# Patient Record
Sex: Male | Born: 1960 | Race: White | Hispanic: No | Marital: Married | Smoking: Never smoker
Health system: Southern US, Community
[De-identification: ages and names within clinical notes are randomized; demographics above are authoritative.]

## PROBLEM LIST (undated history)

## (undated) HISTORY — PX: HIP SURGERY: SHX245

## (undated) HISTORY — PX: KNEE SURGERY: SHX244

---

## 2000-08-11 ENCOUNTER — Ambulatory Visit (HOSPITAL_BASED_OUTPATIENT_CLINIC_OR_DEPARTMENT_OTHER): Admission: RE | Admit: 2000-08-11 | Discharge: 2000-08-11 | Payer: Self-pay | Admitting: Orthopedic Surgery

## 2002-09-30 ENCOUNTER — Ambulatory Visit (HOSPITAL_COMMUNITY): Admission: RE | Admit: 2002-09-30 | Discharge: 2002-09-30 | Payer: Self-pay | Admitting: *Deleted

## 2002-09-30 ENCOUNTER — Encounter (INDEPENDENT_AMBULATORY_CARE_PROVIDER_SITE_OTHER): Payer: Self-pay

## 2007-10-03 ENCOUNTER — Emergency Department (HOSPITAL_COMMUNITY): Admission: EM | Admit: 2007-10-03 | Discharge: 2007-10-03 | Payer: Self-pay | Admitting: Family Medicine

## 2007-10-23 ENCOUNTER — Inpatient Hospital Stay (HOSPITAL_COMMUNITY): Admission: EM | Admit: 2007-10-23 | Discharge: 2007-10-28 | Payer: Self-pay

## 2009-09-12 ENCOUNTER — Emergency Department (HOSPITAL_COMMUNITY): Admission: EM | Admit: 2009-09-12 | Discharge: 2009-09-12 | Payer: Self-pay | Admitting: Emergency Medicine

## 2010-03-29 IMAGING — CR DG ANKLE COMPLETE 3+V*L*
3 series · 3 of 3 positions shown · non-contrast
Comparison: None

CLINICAL DATA: Trauma.

LEFT ANKLE COMPLETE - 3+ VIEW

[view not recorded (1 of 3)]
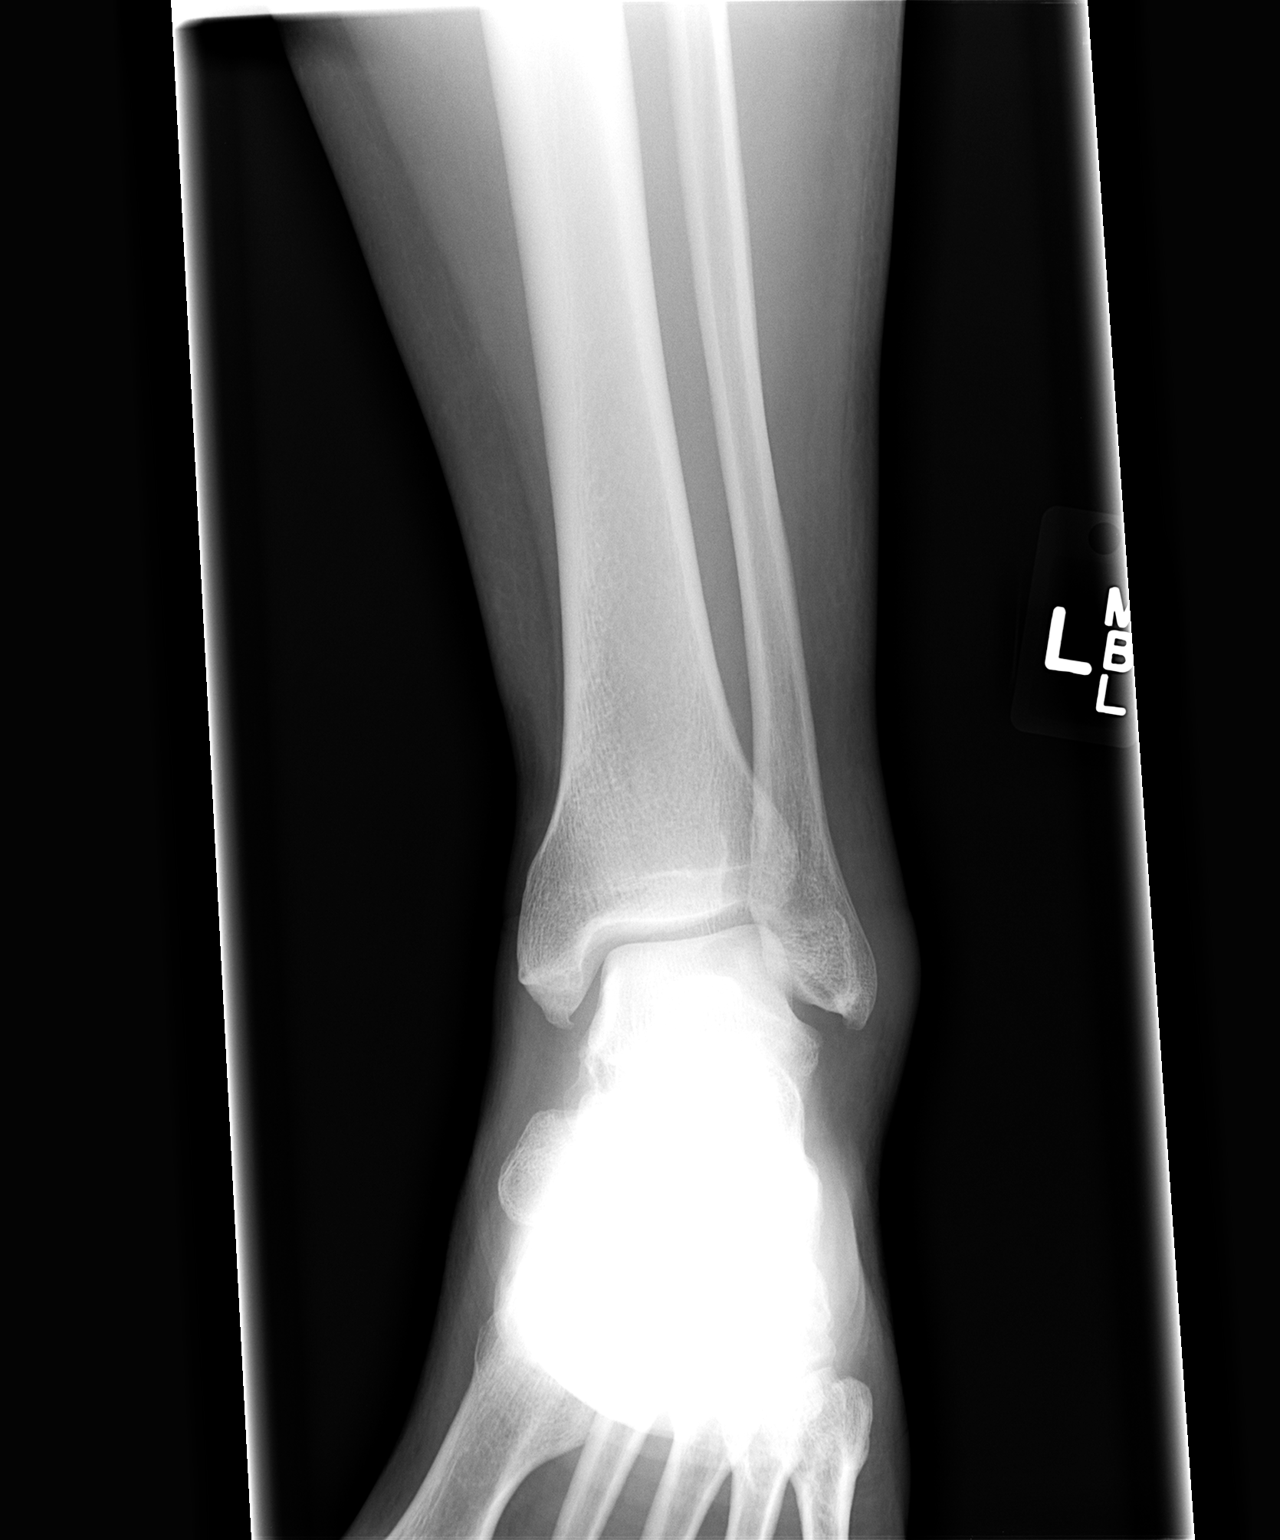

[view not recorded (2 of 3)]
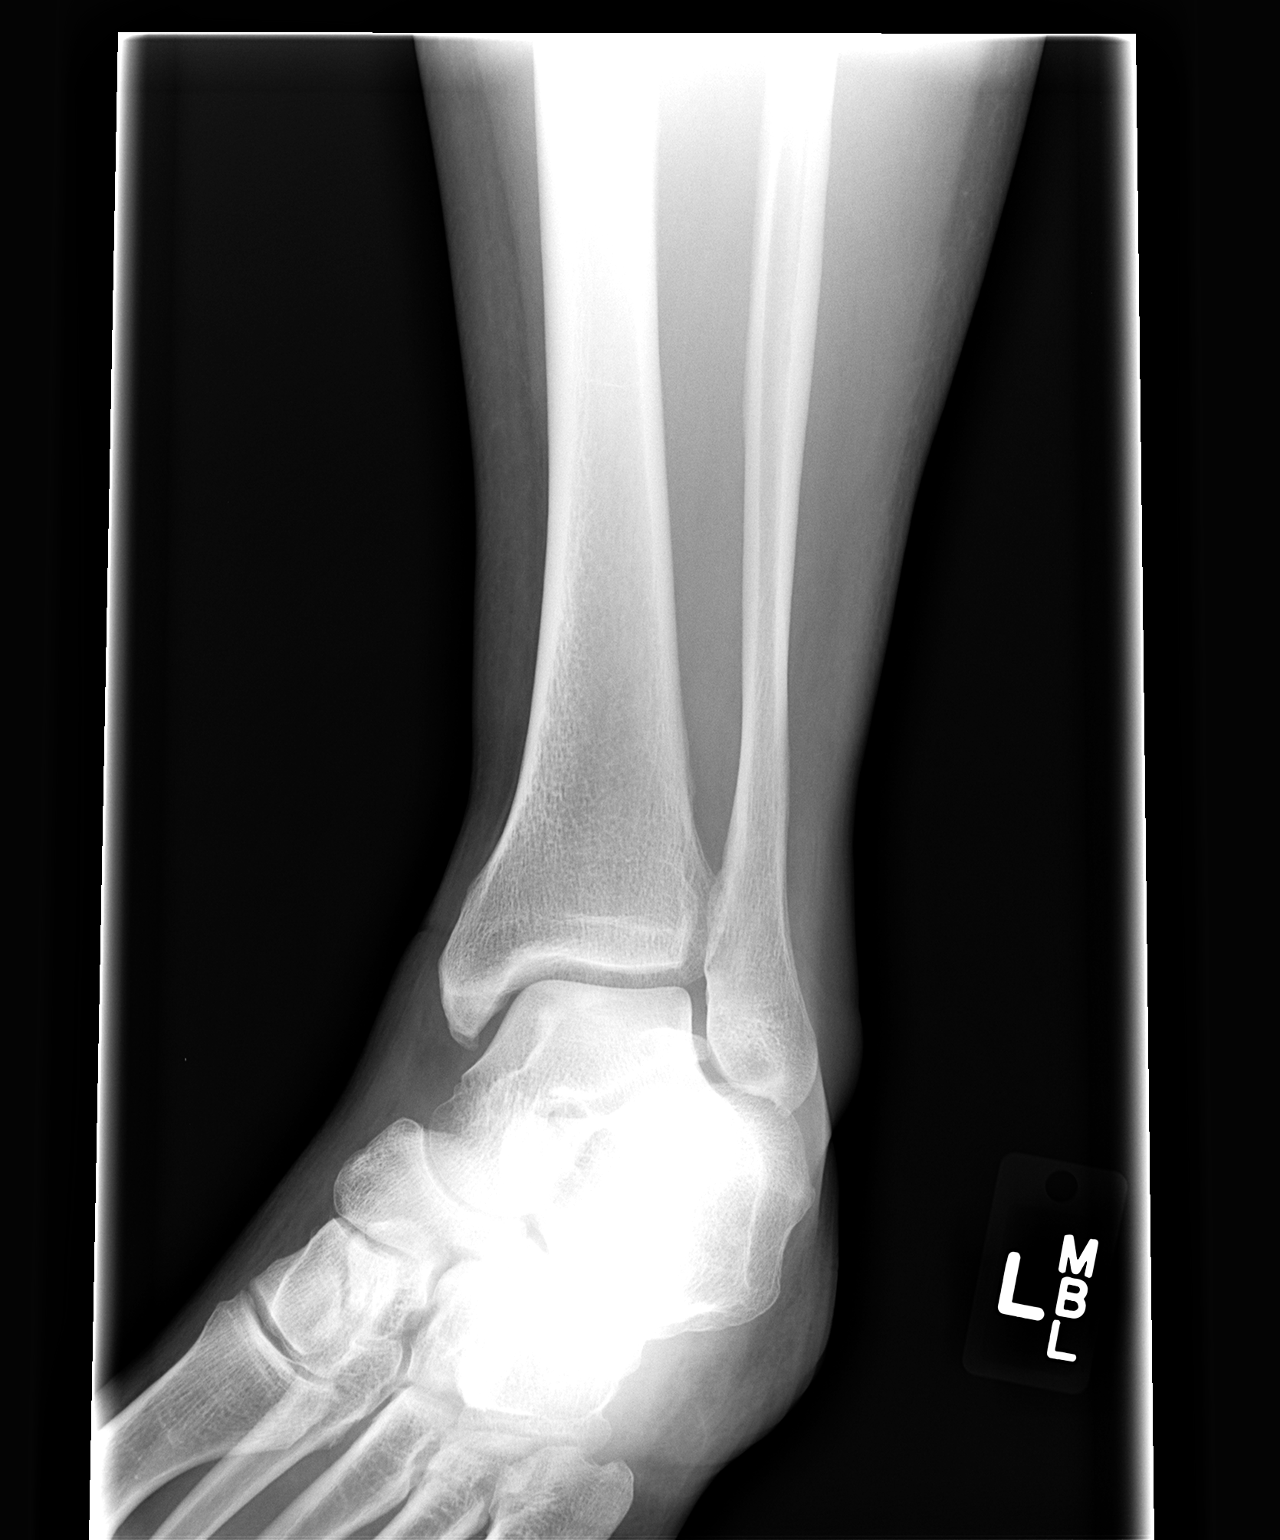

[view not recorded (3 of 3)]
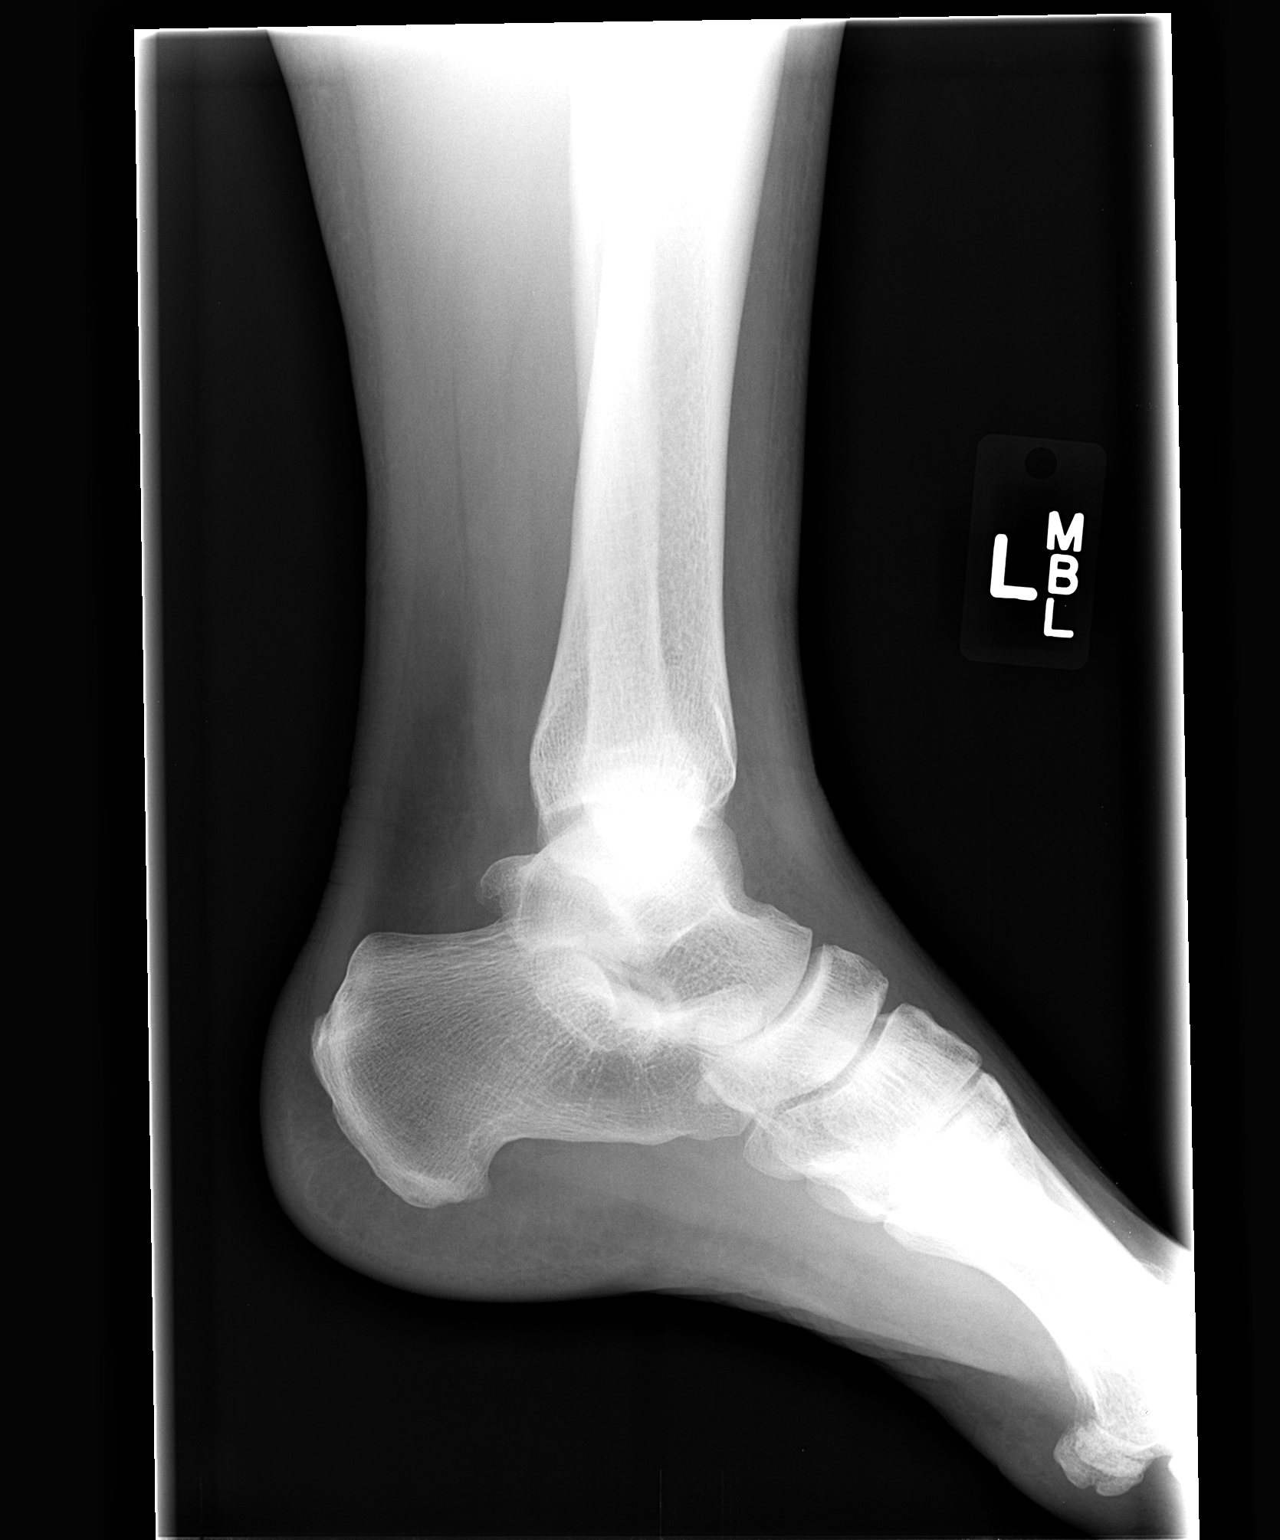

[3 of 3 positions shown; findings below may reference images not displayed]

FINDINGS: There is lateral soft tissue swelling.

No underlying fracture or dislocation is identified.

No radio-opaque foreign bodies or soft tissue calcifications are
noted.
IMPRESSION: 1.  Soft tissue swelling.
2.  No fractures

## 2010-05-25 ENCOUNTER — Ambulatory Visit
Admission: RE | Admit: 2010-05-25 | Discharge: 2010-05-25 | Disposition: A | Discharge status: Home / Self Care | Payer: No Typology Code available for payment source | Source: Ambulatory Visit | Attending: Physical Medicine and Rehabilitation | Admitting: Physical Medicine and Rehabilitation

## 2010-05-25 ENCOUNTER — Other Ambulatory Visit: Payer: Self-pay | Admitting: Physical Medicine and Rehabilitation

## 2010-05-25 DIAGNOSIS — Z0289 Encounter for other administrative examinations: Secondary | ICD-10-CM

## 2010-06-01 NOTE — H&P (Signed)
[name], [name]               ACCOUNT NO.:  [id number]   MEDICAL RECORD NO.:  [id number]          PATIENT TYPE:  INP   LOCATION:  1610                         FACILITY:  [facility]   PHYSICIAN:  Jene Every, M.D.    DATE OF BIRTH:  February 03, 1960   DATE OF ADMISSION:  10/23/2007  DATE OF DISCHARGE:                              HISTORY & PHYSICAL   CHIEF COMPLAINT:  Right hip pain.   HISTORY:  A 50 year old fell 30 feet from a ladder onto his hip, acute  back pain.  Initially, he had some significant numbness into his right  leg, had persistent numbness.  Denies back pain or neck pain.  No loss  of consciousness.  No numbness or tingling in the upper extremities.   PAST MEDICAL HISTORY:  Gastritis.   MEDICATIONS:  Protonix p.r.n.   ALLERGIES:  SULFA.   PHYSICAL EXAMINATION:  GENERAL:  Healthy, mild distress.  Mood and  affect appropriate.  He is postured in flexion of the right hip and  knee.  SKIN:  Intact.  He has small abrasion over the lateral aspect of the  hand but full range of motion of the wrist and hand.  NECK:  C-spine and L-spine are nontender.  PELVIS:  Stable to compression.  EXTREMITIES:  Upper extremity exam is, otherwise, normal.  The right leg  is flexed, again 2+ pulses.  He has decreased sensation in dorsolateral  aspect of foot.  There was swelling into the right buttock, though his  compartment is soft.   Radiographs demonstrates displaced intertrochanteric hip fracture.   IMPRESSION:  1. Displaced intertrochanteric hip fracture acute.  2. Sciatic neurapraxia versus hematoma and fracture displacement.   PLAN:  We will proceed with closed reduction intramedullary nailing on  emergent basis due to the fracture displacement and the dysesthesia in  the right lower extremity.  Risks and benefits discussed of wound,  bleeding, infection, DVT, PE, anesthetic complications, nonunion,  malunion, need for total hip in the further.  We will proceed   accordingly.     Jene Every, M.D.  Electronically Signed    JB/MEDQ  D:  10/23/2007  T:  10/24/2007  Job:  [id number]

## 2010-06-01 NOTE — Op Note (Signed)
[name], [name]               ACCOUNT NO.:  [id number]   MEDICAL RECORD NO.:  [id number]          PATIENT TYPE:  INP   LOCATION:  5034                         FACILITY:  [facility]   PHYSICIAN:  Jene Every, M.D.    DATE OF BIRTH:  October 18, 1960   DATE OF PROCEDURE:  10/23/2007  DATE OF DISCHARGE:                               OPERATIVE REPORT   PREOPERATIVE DIAGNOSIS:  Displaced right intertrochanteric hip fracture.   POSTOPERATIVE DIAGNOSIS:  Displaced right intertrochanteric hip  fracture.   PROCEDURE PERFORMED:  Closed reduction under anesthesia followed by  intramedullary nailing of the right hip.   ANESTHESIA:  General.   ASSISTANT:  None.   BRIEF HISTORY AND INDICATIONS:  A 50 year old fell off a ladder on to  his hip, sustaining an intertrochanteric hip fracture, also had numbness  and dysesthesia into the right leg, and the dorsum and lateral aspect of  the foot.  Initially significant, less so immediately preoperatively.  He was indicated for urgent closed reduction and intramedullary fixation  due to displacement of the fracture and possible effect on the  neurovascular structures.  Risks and benefits discussed including  bleeding, infection, suboptimal range of motion, DVT, PE, anesthetic  complications, and need for revision, total hip, etc.   TECHNIQUE:  The patient in supine position.   The patient in the holding room, underwent a closed reduction under  local sedation due to the delay in the operating room.  We reduced the  fracture, had some improvement in the numbness into his leg.  He had  good dorsiflexion, plantarflexion, and good pulses.   He was then taken to the operative room, underwent general anesthesia,  placed on the fracture table.  Right lower extremity was placed in  longitudinal traction, left in gentle flexion and external rotation.  Perineal post well padded.  Foley was placed.  The right hip and leg was  prepped and draped in usual sterile  fashion.  Under x-ray, the fracture  was reduced near-anatomically.  We made incision proximal to the greater  trochanter.  I incised the fascia, placed a guide pin on the tip of the  trochanter, advanced it into the canal under AP and lateral, overreamed  it, used a 11 mm diameter 130 short nail, advanced it without difficulty  with excellent purchase, I used a guidepin, then advanced it up into the  femoral head neck after a small stab incision was made laterally.  This  was measured to a 115, reamed and tapped to 115 and inserted a 115 lag  screw, and excellent purchase.  This was then compressed.  Traction was  released prior to that.  We placed a distal locking screw in the static  slot.  Small stab incision and blunt dissection down to the femur.  We  drilled and measured it to a 38, inserted a screw bicortically with  excellent purchase in the AP and lateral plane, anatomic reduction of  the fracture and excellent placement of the hardware.  Wounds were  copiously irrigated, the external alignment jig was then removed.  Wound  was copiously irrigated.  Electrocautery was utilized to achieve  hemostasis.  A 0.25% Marcaine with epinephrine was injected in the  proximal hip wound, repaired the fascia with #1 Vicryl interrupted  figure-of-eight sutures.  Subcutaneous tissue were reapproximated with 2-  0 Vicryl simple sutures.  Skin was reapproximated with staples, this was  all 3 wounds.  Wound was dressed  sterilely, he was removed from the fracture table onto the hospital bed,  extubated without difficulty, and transported to the recovery room in  satisfactory condition.   The patient tolerated the procedure well with no complications.  Blood  loss 150 mL.       Jene Every, M.D.  Electronically Signed     JB/MEDQ  D:  10/24/2007  T:  10/24/2007  Job:  [id number]

## 2010-06-04 NOTE — Op Note (Signed)
   [name], [name]                         ACCOUNT NO.:  [id number]   MEDICAL RECORD NO.:  [id number]                   PATIENT TYPE:  AMB   LOCATION:  ENDO                                 FACILITY:  [facility]   PHYSICIAN:  Georgiana Spinner, M.D.                 DATE OF BIRTH:  [birth year]   DATE OF PROCEDURE:  DATE OF DISCHARGE:                                 OPERATIVE REPORT   PROCEDURE:  Upper endoscopy.   INDICATIONS FOR PROCEDURE:  Gastroesophageal reflux disease.   ANESTHESIA:  Demerol 70, Versed 8 mg.   DESCRIPTION OF PROCEDURE:  With the patient mildly sedated in the left  lateral decubitus position, the Olympus videoscopic endoscope was inserted  in the mouth and passed under direct vision through the esophagus which  appeared normal into the stomach. The fundus, body, antrum, duodenal bulb  and second portion of the duodenum were visualized. From this point, the  endoscope was slowly withdrawn taking circumferential views of the duodenal  mucosa until the endoscope was then pulled back in the stomach, placed in  retroflexion to view the stomach from below. The endoscope was then  straightened and withdrawn taking circumferential views of the remaining  gastric and esophageal mucosa. The patient's vital signs and pulse oximeter  remained stable. The patient tolerated the procedure well without apparent  complications.   FINDINGS:  Unremarkable examination.   PLAN:  Proceed to colonoscopy.                                               Georgiana Spinner, M.D.    GMO/MEDQ  D:  09/30/2002  T:  09/30/2002  Job:  [id number]

## 2010-06-04 NOTE — Op Note (Signed)
[place]  Patient:    MILLARD, BAUTCH                      MRN: [id number] Proc. Date: 08/11/00 Adm. Date:  [id number] Attending:  Cornell Barman                           Operative Report  PREOPERATIVE DIAGNOSIS:  Possible internal derangement right knee.  POSTOPERATIVE DIAGNOSIS:  Chondromalacia medial patella facet right knee.  PROCEDURE:  Arthroscopy and debridement medial patellar facet right knee.  SURGEON:  Lenard Galloway. Chaney Malling, M.D.  ANESTHESIA:  MAC.  PATHOLOGY:  With an arthroscope in the knee and a very careful examination both compartments are undertaken.  The articular cartilage over both femoral condyles, both the medial tibial plateaus, and the entire circumflex of the medial and lateral meniscus were examined extremely carefully.  There is absolutely no pathology.  The dorsal and ventral surfaces of both meniscus were palpated including the posterior horn.  The arthroscope was also placed through the posterior recess and the posterior attachment of both menisci were evaluated and these were all normal.  In the medial recess there was a synovial tag up against the medial femoral condyle.  This was somewhat hypertrophic.  The patellofemoral joint was then visualized.  The articular cartilage in the femoral notch area was normal.  There was an area of chondromalacia over the medial patellar facet, otherwise no pathology could be seen in the knee.  When the arthroscope was introduced in the knee, a great deal of synovial fluid was drained.  PROCEDURE:  The patient was placed on the operating table in the supine position with a pneumatic tourniquet applied about the right upper thigh.  The right leg was placed in a leg holder and the entire right lower extremity prepped with Duraprep and draped out in the usual manner.  Marcaine ______, and Xylocaine and epinephrine were used to infiltrate the puncture wounds.   An infusion cannula was placed in the superior medial pouch and the knee ______ with saline.  Anterior median and anterolateral portals were made and the arthroscope was introduced.  As noted above no significant pathology could be seen.  There was a synovial tag which was somewhat hypertrophic and erythematous along the medial femoral condyle.  This was debrided with an intra-articular shaver.  Attention was then turned to the medial patellar facet.  The medial facet was debrided with a chondroplastic shaver through both anteromedian and anterolateral portals.  Excellent decompression was achieved.  Marcaine was then placed in the knee and a large bulky compression dressing applied.  The patient having returned to recovery room in excellent condition.  Technically this procedure went extremely well.  FOLLOWUP:  Follow up in my office in 1 week. DD:  08/11/00 TD:  08/11/00 Job: [id number]

## 2010-06-04 NOTE — Discharge Summary (Signed)
[name], [name]               ACCOUNT NO.:  [id number]   MEDICAL RECORD NO.:  [id number]          PATIENT TYPE:  INP   LOCATION:  1478                         FACILITY:  [facility]   PHYSICIAN:  Jene Every, M.D.    DATE OF BIRTH:  06-06-1960   DATE OF ADMISSION:  10/23/2007  DATE OF DISCHARGE:  10/28/2007                               DISCHARGE SUMMARY   ADMISSION DIAGNOSES:  1. Right intertrochanteric hip fracture compressing sciatic nerve.  2. Gastroesophageal reflux disease.  3. History of gastritis.   DISCHARGE DIAGNOSES:  1. Status post intramedullary nail of the right hip, asymptomatic.  2. Postoperative anemia.   PROCEDURE:  The patient was taken to the OR on October 23, 2007 and  underwent IM nailing of the right hip.   SURGEON:  Jene Every, MD   ASSISTANT:  None.   ANESTHESIA:  General.   COMPLICATIONS:  None.   CONSULTATIONS:  PT, OT, and case management.   HISTORY:  [name] is a pleasant 50 year old gentleman who fell  approximately 30 feet off a ladder.  He noted acute onset of hip and  back pain and initially was evaluated in the emergency room by Dr.  Shelle Iron.  He had significant numbness in the right lower extremity that  persisted.  He denied any loss of consciousness.  X-rays did reveal a  displaced intertrochanteric hip fracture that was compressing the  sciatic nerve.  Secondary to the hematoma and fracture displacement, it  was felt the patient would need immediate closed reduction and IM  nailing.  Risks and benefits of this were discussed with the patient and  his family and they did elect to proceed.   HOSPITAL COURSE:  The patient was admitted and taken immediately to the  operating room.  He underwent the above stated procedure without  difficulties, then transferred to the PACU and then to the orthopedic  floor for continued postoperative care.  Postoperatively, he did fairly  well.  He did have some pain control issues.  Labs remained  stable  throughout the hospital course.  The preoperative H and H 14.3 and 41.9.  This was monitored throughout the hospital course.  Hemoglobin did drop  to a level of 12.6 and hematocrit 26.5 at time of discharge.  Slightly  elevated white cell count, but this normalized prior to discharge.  The  patient was placed on Coumadin postoperatively for DVT prophylaxis.  At  the time of discharge, he had PT of 20.9 and INR of 1.7.  Throughout the  hospital course, the incision remained clean and dry.  Motor and  neurovascular function continued to improve into the lower extremity.  The pedal pulses were equal.  The patient did progress slowly with  therapy.  Pain medication was adjusted accordingly.  Once the patient's  pain was controlled, he was felt stable to be discharged home with home  health, PT, and OT.   DISPOSITION:  The patient was discharged home.   DIET:  Advance as tolerated.   DISCHARGE MEDICATIONS:  1. Percocet 5/325 one to two p.o. q. 4-6 p.r.n. pain.  2. Robaxin 500 mg one p.o. q.8 p.r.n. spasm.  3. Coumadin as dosed per pharmacy.  4. Vitamin C 500 mg.   ACTIVITIES:  Partial weightbearing on the right lower extremity.  Continue with ice and daily wound care including dressing changes.  Okay  for him to shower.  Hip precautions.   CONDITION ON DISCHARGE:  Stable.   FINAL DIAGNOSIS:  Status post right intramedullary nailing of the hip.      Roma Schanz, P.A.      Jene Every, M.D.  Electronically Signed    CS/MEDQ  D:  12/17/2007  T:  12/17/2007  Job:  [id number]

## 2010-06-04 NOTE — Op Note (Signed)
[place]. [facility]  Patient:    [name], [name]                      MRN: [id number] Proc. Date: 08/11/00 Adm. Date:  [id number] Attending:  Cornell Barman                           Operative Report  PREOPERATIVE DIAGNOSIS:  Possible internal derangement, right knee.  POSTOPERATIVE DIAGNOSIS:  Chondromalacia, medial patellar facet, right knee.  PROCEDURE:  Arthroscopy and debridement of medial patellar facet, right knee.  SURGEON:  Lenard Galloway. Chaney Malling, M.D.  ANESTHESIA:  MAC.  PATHOLOGY:  With an arthroscope in the knee, very careful examination of both compartments was undertaken.  The articular cartilage over both femoral condyles, both the medial tibial plateaus, and the entire circumference of the medial and lateral meniscus were examined extremely carefully.  There was absolutely no pathology.  The dorsal and ventral surface of both menisci were palpated, including the posterior horn.  The arthroscope was also placed in through the posterior recess, and the posterior attachment of both menisci were evaluated and these were all normal.  In the medial recess, there was a synovial tag up against the medial femoral condyle.  This was somewhat hypertrophic.  The patellofemoral joint was then visualized.  The articular cartilage in the femoral notch area was normal.  There was an area of chondromalacia over the medial patellar facet.  Otherwise no pathology could be seen in the knee.  When the arthroscope was introduced in the knee, a great deal of synovial fluid was drained.  DESCRIPTION OF PROCEDURE:  Patient placed on the operating table in the supine position with the pneumatic tourniquet about the right thigh.  The right leg was placed in the leg holder, entire right lower extremity prepped with Duraprep and draped out in the usual manner.  Marcaine then placed in the knee and Xylocaine and epinephrine used to infiltrated the puncture  wounds.  An infusion cannula was placed in the superior medial pouch and the knee distended with saline.  Anterior medial and anterior lateral portals were made, and the arthroscope was introduced.  As noted above, no significant pathology could be seen.  There was a synovial tag, which was somewhat hypertrophic and erythematous along the medial femoral condyle.  This was debrided with the intra-articular shaver.  Attention was then turned to the medial patellar facet.  The medial facet was debrided with the chondroplastic shaver for both the anterior medial and anterior lateral portals.  Excellent decompression was achieved.  Marcaine was then placed in the knee and a large bulky compression dressing applied.  The patient then returned to the recovery room in excellent condition.  Technically this procedure went extremely well.  FOLLOW-UP CARE:  To my office in one week. DD:  08/11/00 TD:  08/11/00 Job: [id number]

## 2010-06-04 NOTE — Op Note (Signed)
   [name], [name]                         ACCOUNT NO.:  [id number]   MEDICAL RECORD NO.:  [id number]                   PATIENT TYPE:  AMB   LOCATION:  ENDO                                 FACILITY:  [facility]   PHYSICIAN:  Georgiana Spinner, M.D.                 DATE OF BIRTH:  Jan 02, 1961   DATE OF PROCEDURE:  09/30/2002  DATE OF DISCHARGE:                                 OPERATIVE REPORT   PROCEDURE:  Colonoscopy.   ANESTHESIA:  Demerol 20 mg, Versed 1 mg.   DESCRIPTION OF PROCEDURE:  With the patient mildly sedated in the left  lateral decubitus position, a rectal exam was performed which was  unremarkable.  Subsequently the Olympus videoscopic colonoscope was inserted  in the rectum and passed under direct vision to the cecum, identified by the  ileocecal valve and crow's foot of the cecum.  From this point the  colonoscope was slowly withdrawn, taking circumferential views of the  colonic mucosa, stopping at 20 cm from the anal verge, where one small polyp  was seen, photographed, and removed using hot biopsy forceps technique, a  setting of 20-200 on the ERBE argon photocoagulator pulse generator.  The  endoscope was withdrawn then all the way to the rectum, which appeared  normal on direct and retroflexed view.  The endoscope was straightened and  withdrawn.  The patient's vital signs and pulse oximetry remained stable.  The patient tolerated the procedure well without apparent complications.   FINDINGS:  Polyp at 20 cm from the anal verge.   Await biopsy report.  The patient will call me for results and follow up  with me as an outpatient.                                               Georgiana Spinner, M.D.    GMO/MEDQ  D:  09/30/2002  T:  09/30/2002  Job:  [id number]

## 2010-10-18 LAB — PROTIME-INR
INR: 1.2
INR: 1.3
INR: 1.7 — ABNORMAL HIGH
INR: 1.7 — ABNORMAL HIGH
INR: 2.3 — ABNORMAL HIGH
Prothrombin Time: 16.2 — ABNORMAL HIGH
Prothrombin Time: 20.9 — ABNORMAL HIGH

## 2010-10-18 LAB — DIFFERENTIAL
Basophils Absolute: 0.1
Basophils Relative: 1
Eosinophils Absolute: 0.1
Monocytes Absolute: 0.8
Monocytes Relative: 5
Neutrophils Relative %: 80 — ABNORMAL HIGH

## 2010-10-18 LAB — CBC
HCT: 37.5 — ABNORMAL LOW
HCT: 41.9
Hemoglobin: 12.7 — ABNORMAL LOW
Hemoglobin: 13
MCHC: 33.9
MCHC: 34
MCHC: 34
MCV: 95.1
Platelets: 170
Platelets: 194
Platelets: 208
RBC: 4.02 — ABNORMAL LOW
RBC: 4.4
RDW: 13.8
WBC: 12.1 — ABNORMAL HIGH

## 2010-10-18 LAB — BASIC METABOLIC PANEL
BUN: 11
Creatinine, Ser: 0.85
Glucose, Bld: 98
Potassium: 3.6

## 2010-10-18 LAB — APTT
aPTT: 30
aPTT: 44 — ABNORMAL HIGH

## 2011-08-18 ENCOUNTER — Encounter (HOSPITAL_COMMUNITY): Payer: Self-pay

## 2011-08-18 ENCOUNTER — Emergency Department (INDEPENDENT_AMBULATORY_CARE_PROVIDER_SITE_OTHER)
Admission: EM | Admit: 2011-08-18 | Discharge: 2011-08-18 | Disposition: A | Discharge status: Home / Self Care | Payer: Worker's Compensation | Source: Home / Self Care

## 2011-08-18 DIAGNOSIS — S0190XA Unspecified open wound of unspecified part of head, initial encounter: Secondary | ICD-10-CM

## 2011-08-18 DIAGNOSIS — S0191XA Laceration without foreign body of unspecified part of head, initial encounter: Secondary | ICD-10-CM

## 2011-08-18 MED ORDER — TETANUS-DIPHTH-ACELL PERTUSSIS 5-2.5-18.5 LF-MCG/0.5 IM SUSP
0.5000 mL | Freq: Once | INTRAMUSCULAR | Status: AC
  Administered 2011-08-18: 0.5 mL via INTRAMUSCULAR

## 2011-08-18 MED ORDER — TETANUS-DIPHTH-ACELL PERTUSSIS 5-2.5-18.5 LF-MCG/0.5 IM SUSP
INTRAMUSCULAR | Status: AC
  Filled 2011-08-18: qty 0.5

## 2011-08-18 NOTE — ED Provider Notes (Signed)
Medical screening examination/treatment/procedure(s) were performed by a resident physician and as supervising physician I was immediately available for consultation/collaboration.  Leslee Home, M.D.   Reuben Likes, MD 08/18/11 2014

## 2011-08-18 NOTE — ED Provider Notes (Signed)
[name] is a 51 y.o. male who presents to Urgent Care today for left for head laceration. Patient suffered a laceration to the left side of his forehead today at about 4:30 PM while at work.  He struck his forehead on an exposed metal beam.  He denies any lightheadedness loss of consciousness dizziness fogginess or headache.  He's not sure when his last tetanus shot was.     PMH reviewed. Otherwise healthy ROS as above Medications reviewed. Current Facility-Administered Medications  Medication Dose Route Frequency Provider Last Rate Last Dose  . TDaP (BOOSTRIX) injection 0.5 mL  0.5 mL Intramuscular Once [name], MD       No current outpatient prescriptions on file.    Exam:  BP 120/74  Pulse 62  Temp 98.7 F (37.1 C) (Oral)  Resp 16  SpO2 98% Gen: Well NAD SKIN: 2 cm linear laceration on the left for head.  Neuro: Alert and oriented normal gait normal motions throughout.  Procedure:  Laceration repair.  Area cleaned with alcohol and then anesthetized with 2% lidocaine with epinephrine (2ml).  Then the laceration was cleaned and irrigated copiously with sterile saline.  The wound was then prepped with Betadine and sterile dressing applied.  4 interrupted simple sutures were used to close the laceration.  The Betadine was then removed with water. The laceration was then covered with antibiotic ointment and a dressing was applied.  Minimal bleeding patient tolerated the procedure well.  Used for 4-0 Ethilon  Assessment and Plan: 51 y.o. male with laceration repair.  This occurred at work.  Patient appears to be neurologically well.  He may return to work tomorrow. No need for antibiotics as this wound looks clean.  Sutures out in 7-10 days. Discussed warning signs or symptoms. Please see discharge instructions. Patient expresses understanding.      [name], MD 08/18/11 [phone]

## 2011-08-18 NOTE — ED Notes (Signed)
States that while at work, he struck his forehead on exposed metal beam, causing laceration , ~2-3 cm. Denied LOC, no active bleeding at present

## 2011-08-18 NOTE — ED Notes (Signed)
Spoke w Lourena Simmonds , making manager, who came in with patient, who had said that [employer] does not do routine post accident testing

## 2016-02-16 ENCOUNTER — Encounter (HOSPITAL_COMMUNITY): Payer: Self-pay | Admitting: *Deleted

## 2016-02-16 ENCOUNTER — Ambulatory Visit (HOSPITAL_COMMUNITY)
Admission: EM | Admit: 2016-02-16 | Discharge: 2016-02-16 | Disposition: A | Discharge status: Home / Self Care | Payer: 59 | Attending: Family Medicine | Admitting: Family Medicine

## 2016-02-16 DIAGNOSIS — J9801 Acute bronchospasm: Secondary | ICD-10-CM

## 2016-02-16 DIAGNOSIS — J069 Acute upper respiratory infection, unspecified: Secondary | ICD-10-CM

## 2016-02-16 DIAGNOSIS — R0982 Postnasal drip: Secondary | ICD-10-CM

## 2016-02-16 MED ORDER — ALBUTEROL SULFATE (2.5 MG/3ML) 0.083% IN NEBU
5.0000 mg | INHALATION_SOLUTION | Freq: Once | RESPIRATORY_TRACT | Status: AC
  Administered 2016-02-16: 5 mg via RESPIRATORY_TRACT

## 2016-02-16 MED ORDER — ALBUTEROL SULFATE HFA 108 (90 BASE) MCG/ACT IN AERS: 2.0000 | INHALATION_SPRAY | RESPIRATORY_TRACT | 0 refills | Status: DC | PRN

## 2016-02-16 MED ORDER — IPRATROPIUM-ALBUTEROL 0.5-2.5 (3) MG/3ML IN SOLN
RESPIRATORY_TRACT | Status: AC
  Filled 2016-02-16: qty 3

## 2016-02-16 MED ORDER — ALBUTEROL SULFATE (2.5 MG/3ML) 0.083% IN NEBU
INHALATION_SOLUTION | RESPIRATORY_TRACT | Status: AC
  Filled 2016-02-16: qty 3

## 2016-02-16 MED ORDER — METHYLPREDNISOLONE ACETATE 40 MG/ML IJ SUSP
INTRAMUSCULAR | Status: AC
  Filled 2016-02-16: qty 1

## 2016-02-16 MED ORDER — ALBUTEROL SULFATE (2.5 MG/3ML) 0.083% IN NEBU
INHALATION_SOLUTION | RESPIRATORY_TRACT | Status: AC
Start: 2016-02-16 — End: 2016-02-16
  Filled 2016-02-16: qty 3

## 2016-02-16 MED ORDER — IPRATROPIUM-ALBUTEROL 0.5-2.5 (3) MG/3ML IN SOLN
3.0000 mL | Freq: Once | RESPIRATORY_TRACT | Status: AC
  Administered 2016-02-16: 3 mL via RESPIRATORY_TRACT

## 2016-02-16 MED ORDER — PREDNISONE 50 MG PO TABS: ORAL_TABLET | ORAL | 0 refills | Status: DC

## 2016-02-16 MED ORDER — GUAIFENESIN-CODEINE 100-10 MG/5ML PO SYRP: ORAL_SOLUTION | ORAL | 0 refills | Status: DC

## 2016-02-16 MED ORDER — METHYLPREDNISOLONE ACETATE 40 MG/ML IJ SUSP
40.0000 mg | Freq: Once | INTRAMUSCULAR | Status: AC
  Administered 2016-02-16: 40 mg via INTRAMUSCULAR

## 2016-02-16 MED ORDER — DEXAMETHASONE SODIUM PHOSPHATE 10 MG/ML IJ SOLN
INTRAMUSCULAR | Status: AC
Start: 2016-02-16 — End: 2016-02-16
  Filled 2016-02-16: qty 1

## 2016-02-16 MED ORDER — DEXAMETHASONE SODIUM PHOSPHATE 10 MG/ML IJ SOLN
10.0000 mg | Freq: Once | INTRAMUSCULAR | Status: AC
  Administered 2016-02-16: 10 mg via INTRAMUSCULAR

## 2016-02-16 NOTE — ED Triage Notes (Signed)
Cough    X  2  Days        Pt  Reports    Nasal  Drainage         Pt reports   Symptoms  Not  releived   By otc  meds

## 2016-02-16 NOTE — Discharge Instructions (Signed)
You cough is primarily due to bronchospasm or wheezing. He also has some drainage that contributes to cough. Start using the albuterol inhaler 2 puffs every 4 hours. Take the prednisone daily as directed. Take with food. Also recommend take Allegra or Zyrtec daily to help minimize drainage in the back of your throat. If he developed fever, shortness of breath, worsening cough or other problems may return or if needed go to the emergency department.

## 2016-02-16 NOTE — ED Provider Notes (Signed)
CSN: [id number]     Arrival date & time 02/16/16  1153 History   First MD Initiated Contact with Patient 02/16/16 1401     Chief Complaint  Patient presents with  . Cough   (Consider location/radiation/quality/duration/timing/severity/associated sxs/prior Treatment) 56 year old male complaining of cough started yesterday. It is worse when lying supine. He feels a tickle in his throat that he is certain is due to possible wheezing. He does not have a history of asthma or smoking. He denies PND or fevers but he did have a chill.      History reviewed. No pertinent past medical history. History reviewed. No pertinent surgical history. History reviewed. No pertinent family history. Social History  Substance Use Topics  . Smoking status: Never Smoker  . Smokeless tobacco: Never Used  . Alcohol use Yes    Review of Systems  Constitutional: Negative.   HENT: Positive for congestion. Negative for ear pain.   Eyes: Negative.   Respiratory: Positive for cough. Negative for shortness of breath.   Cardiovascular: Negative for chest pain.  Gastrointestinal: Negative.   Skin: Negative.   Neurological: Negative.   All other systems reviewed and are negative.   Allergies  Sulfa antibiotics  Home Medications   Prior to Admission medications   Not on File   Meds Ordered and Administered this Visit   Medications  ipratropium-albuterol (DUONEB) 0.5-2.5 (3) MG/3ML nebulizer solution 3 mL (3 mLs Nebulization Given 02/16/16 1418)  albuterol (PROVENTIL) (2.5 MG/3ML) 0.083% nebulizer solution 5 mg (5 mg Nebulization Given 02/16/16 1447)  dexamethasone (DECADRON) injection 10 mg (10 mg Intramuscular Given 02/16/16 1452)  methylPREDNISolone acetate (DEPO-MEDROL) injection 40 mg (40 mg Intramuscular Given 02/16/16 1453)    BP 130/83 (BP Location: Right Arm)   Pulse (!) 58   Temp 98.8 F (37.1 C) (Oral)   Resp 14   SpO2 100%  No data found.   Physical Exam  Constitutional: He is  oriented to person, place, and time. He appears well-developed and well-nourished. No distress.  Neck: Normal range of motion. Neck supple.  Cardiovascular: Normal rate.   Pulmonary/Chest: Effort normal. No respiratory distress. He has wheezes.  Expiratory wheezes. Fair air movement.  Abdominal: Soft.  Musculoskeletal: Normal range of motion.  Neurological: He is alert and oriented to person, place, and time.  Skin: Skin is warm.  Nursing note and vitals reviewed.   Urgent Care Course     Procedures (including critical care time)  Labs Review Labs Reviewed - No data to display  Imaging Review No results found.   Visual Acuity Review  Right Eye Distance:   Left Eye Distance:   Bilateral Distance:    Right Eye Near:   Left Eye Near:    Bilateral Near:         MDM   1. Cough due to bronchospasm   2. PND (post-nasal drip)   3. Acute upper respiratory infection    1435 hrs. Post DuoNeb the patient has modest improvement in air movement. He states he does not really feel much better. He is moving air fairly well. Expiratory wheezing/rhonchi has not changed much. After the second albuterol neb 5 mg there is improved air movement and a decrease in wheeze. Patient will be discharged on albuterol as directed as well has prednisone daily. He is aware that if he gets worse that he may return and we discussed red flags on symptoms that may be serious. You cough is primarily due to bronchospasm or wheezing. He also has  some drainage that contributes to cough. Start using the albuterol inhaler 2 puffs every 4 hours. Take the prednisone daily as directed. Take with food. Also recommend take Allegra or Zyrtec daily to help minimize drainage in the back of your throat. If he developed fever, shortness of breath, worsening cough or other problems may return or if needed go to the emergency department. Meds ordered this encounter  Medications  . ipratropium-albuterol (DUONEB) 0.5-2.5 (3)  MG/3ML nebulizer solution 3 mL  . albuterol (PROVENTIL) (2.5 MG/3ML) 0.083% nebulizer solution 5 mg  . dexamethasone (DECADRON) injection 10 mg  . methylPREDNISolone acetate (DEPO-MEDROL) injection 40 mg   Meds ordered this encounter  Medications  . ipratropium-albuterol (DUONEB) 0.5-2.5 (3) MG/3ML nebulizer solution 3 mL  . albuterol (PROVENTIL) (2.5 MG/3ML) 0.083% nebulizer solution 5 mg  . dexamethasone (DECADRON) injection 10 mg  . methylPREDNISolone acetate (DEPO-MEDROL) injection 40 mg  . albuterol (PROVENTIL HFA;VENTOLIN HFA) 108 (90 Base) MCG/ACT inhaler    Sig: Inhale 2 puffs into the lungs every 4 (four) hours as needed for wheezing or shortness of breath.    Dispense:  1 Inhaler    Refill:  0    Order Specific Question:   Supervising Provider    Answer:   Bradd CanaryKINDL, JAMES D [id number]]  . predniSONE (DELTASONE) 50 MG tablet    Sig: 1 tab po daily for 6 days. Take with food.    Dispense:  6 tablet    Refill:  0    Order Specific Question:   Supervising Provider    Answer:   Linna HoffKINDL, JAMES D [phone][[id number]]  . guaiFENesin-codeine (CHERATUSSIN AC) 100-10 MG/5ML syrup    Sig: Take 1 to 2 tsp q 4h prn cough.    Dispense:  120 mL    Refill:  0    Order Specific Question:   Supervising Provider    Answer:   Linna HoffKINDL, JAMES D [[id number]]        Hayden Rasmussenavid Zorianna Taliaferro, NP 02/16/16 1527    Hayden Rasmussenavid Baron Parmelee, NP 02/16/16 1531

## 2018-09-21 ENCOUNTER — Ambulatory Visit (INDEPENDENT_AMBULATORY_CARE_PROVIDER_SITE_OTHER): Payer: 59

## 2018-09-21 ENCOUNTER — Ambulatory Visit (HOSPITAL_COMMUNITY)
Admission: EM | Admit: 2018-09-21 | Discharge: 2018-09-21 | Disposition: A | Discharge status: Home / Self Care | Payer: 59 | Attending: Family Medicine | Admitting: Family Medicine

## 2018-09-21 ENCOUNTER — Encounter (HOSPITAL_COMMUNITY): Payer: Self-pay

## 2018-09-21 ENCOUNTER — Other Ambulatory Visit: Payer: Self-pay

## 2018-09-21 DIAGNOSIS — W298XXA Contact with other powered powered hand tools and household machinery, initial encounter: Secondary | ICD-10-CM

## 2018-09-21 DIAGNOSIS — Z23 Encounter for immunization: Secondary | ICD-10-CM | POA: Diagnosis not present

## 2018-09-21 DIAGNOSIS — S61032A Puncture wound without foreign body of left thumb without damage to nail, initial encounter: Secondary | ICD-10-CM

## 2018-09-21 MED ORDER — TETANUS-DIPHTH-ACELL PERTUSSIS 5-2.5-18.5 LF-MCG/0.5 IM SUSP
INTRAMUSCULAR | Status: AC
  Filled 2018-09-21: qty 0.5

## 2018-09-21 MED ORDER — TETANUS-DIPHTH-ACELL PERTUSSIS 5-2.5-18.5 LF-MCG/0.5 IM SUSP
0.5000 mL | Freq: Once | INTRAMUSCULAR | Status: AC
  Administered 2018-09-21: 21:00:00 0.5 mL via INTRAMUSCULAR

## 2018-09-21 MED ORDER — HYDROCODONE-ACETAMINOPHEN 7.5-325 MG PO TABS: 1.0000 | ORAL_TABLET | Freq: Four times a day (QID) | ORAL | 0 refills | Status: AC | PRN

## 2018-09-21 MED ORDER — AMOXICILLIN-POT CLAVULANATE 875-125 MG PO TABS: 1.0000 | ORAL_TABLET | Freq: Two times a day (BID) | ORAL | 0 refills | Status: AC

## 2018-09-21 NOTE — Discharge Instructions (Addendum)
Watch for signs of infection.  This would include pain, swelling, redness, and pus Take the antibiotic for 3 days.  This will prevent infection from setting it I am giving you a prescription for pain medication in case it is needed.  If you take the hydrocodone do not drive Expect improvement over the next several days.  Call or return for any problems

## 2018-09-21 NOTE — ED Provider Notes (Signed)
[facility]    CSN: [id number] Arrival date & time: 09/21/18  1939      History   Chief Complaint Chief Complaint  Patient presents with  . Thumb Injury    HPI [name] is a 58 y.o. male.   HPI  Was doing a home project and a drill bit broke and went straight through his thumb.  It is a through and through injury.  He does not think there is any drill bit left in his thumb.  He does not think it hit the bone but is not sure.  He is here for wound care  History reviewed. No pertinent past medical history.  There are no active problems to display for this patient.   Past Surgical History:  Procedure Laterality Date  . HIP SURGERY    . KNEE SURGERY         Home Medications    Prior to Admission medications   Medication Sig Start Date End Date Taking? Authorizing Provider  albuterol (PROVENTIL HFA;VENTOLIN HFA) 108 (90 Base) MCG/ACT inhaler Inhale 2 puffs into the lungs every 4 (four) hours as needed for wheezing or shortness of breath. 02/16/16   Hayden RasmussenMabe, David, NP  amoxicillin-clavulanate (AUGMENTIN) 875-125 MG tablet Take 1 tablet by mouth every 12 (twelve) hours. 09/21/18   Eustace MooreNelson, Aarionna Germer Sue, MD  HYDROcodone-acetaminophen (NORCO) 7.5-325 MG tablet Take 1 tablet by mouth every 6 (six) hours as needed for up to 3 days for moderate pain. 09/21/18 09/24/18  Eustace MooreNelson, Shante Maysonet Sue, MD    Family History Family History  Family history unknown: Yes    Social History Social History   Tobacco Use  . Smoking status: Never Smoker  . Smokeless tobacco: Never Used  Substance Use Topics  . Alcohol use: Yes  . Drug use: Not on file     Allergies   Sulfa antibiotics   Review of Systems Review of Systems  Constitutional: Negative for chills and fever.  HENT: Negative for ear pain and sore throat.   Eyes: Negative for pain and visual disturbance.  Respiratory: Negative for cough and shortness of breath.   Cardiovascular: Negative for chest pain and  palpitations.  Gastrointestinal: Negative for abdominal pain and vomiting.  Genitourinary: Negative for dysuria and hematuria.  Musculoskeletal: Negative for arthralgias and back pain.  Skin: Positive for wound. Negative for color change and rash.  Neurological: Negative for seizures and syncope.  All other systems reviewed and are negative.    Physical Exam Triage Vital Signs ED Triage Vitals [09/21/18 2035]  Enc Vitals Group     BP (!) 137/91     Pulse Rate 68     Resp 17     Temp 98.2 F (36.8 C)     Temp Source Oral     SpO2 92 %     Weight      Height      Head Circumference      Peak Flow      Pain Score 8     Pain Loc      Pain Edu?      Excl. in GC?    No data found.  Updated Vital Signs BP (!) 137/91 (BP Location: Left Arm)   Pulse 68   Temp 98.2 F (36.8 C) (Oral)   Resp 17   SpO2 92%  :     Physical Exam Constitutional:      General: He is not in acute distress.    Appearance:  He is well-developed.  HENT:     Head: Normocephalic and atraumatic.  Eyes:     Conjunctiva/sclera: Conjunctivae normal.     Pupils: Pupils are equal, round, and reactive to light.  Neck:     Musculoskeletal: Normal range of motion.  Cardiovascular:     Rate and Rhythm: Normal rate.  Pulmonary:     Effort: Pulmonary effort is normal. No respiratory distress.  Abdominal:     General: There is no distension.     Palpations: Abdomen is soft.  Musculoskeletal: Normal range of motion.  Skin:    General: Skin is warm and dry.     Comments: The left thumb has an entrance wound just to the edge of the nail that exits through the pad  Neurological:     Mental Status: He is alert.     Sensation is intact  UC Treatments / Results  Labs (all labs ordered are listed, but only abnormal results are displayed) Labs Reviewed - No data to display  EKG   Radiology Dg Finger Thumb Left  Result Date: 09/21/2018 CLINICAL DATA:  Drill through finger EXAM: LEFT THUMB 2+V  COMPARISON:  None. FINDINGS: There is no evidence of fracture or dislocation. No radiopaque foreign body. Soft tissue swelling seen around the digit. IMPRESSION: No acute osseous abnormality. Electronically Signed   By: Prudencio Pair M.D.   On: 09/21/2018 21:05    Procedures Procedures (including critical care time)  Medications Ordered in UC Medications  Tdap (BOOSTRIX) injection 0.5 mL (0.5 mLs Intramuscular Given 09/21/18 2049)  Tdap (BOOSTRIX) 5-2.5-18.5 LF-MCG/0.5 injection (has no administration in time range)    Initial Impression / Assessment and Plan / UC Course  I have reviewed the triage vital signs and the nursing notes.  Pertinent labs & imaging results that were available during my care of the patient were reviewed by me and considered in my medical decision making (see chart for details).     Discussed wound care Final Clinical Impressions(s) / UC Diagnoses   Final diagnoses:  Puncture wound of left thumb, initial encounter     Discharge Instructions     Watch for signs of infection.  This would include pain, swelling, redness, and pus Take the antibiotic for 3 days.  This will prevent infection from setting it I am giving you a prescription for pain medication in case it is needed.  If you take the hydrocodone do not drive Expect improvement over the next several days.  Call or return for any problems   ED Prescriptions    Medication Sig Dispense Auth. Provider   amoxicillin-clavulanate (AUGMENTIN) 875-125 MG tablet Take 1 tablet by mouth every 12 (twelve) hours. 10 tablet Raylene Everts, MD   HYDROcodone-acetaminophen [facility]) 7.5-325 MG tablet Take 1 tablet by mouth every 6 (six) hours as needed for up to 3 days for moderate pain. 12 tablet Raylene Everts, MD     Controlled Substance Prescriptions [place] Controlled Substance Registry consulted? Yes, I have consulted the [place] Controlled Substances Registry for this patient, and feel the risk/benefit ratio  today is favorable for proceeding with this prescription for a controlled substance.   Raylene Everts, MD 09/21/18 2123

## 2018-09-21 NOTE — ED Triage Notes (Signed)
Pt presents with left thumb injury from broken drill bit that went thru his thumb finger today.

## 2020-01-24 ENCOUNTER — Other Ambulatory Visit: Payer: Self-pay

## 2020-01-24 ENCOUNTER — Other Ambulatory Visit: Payer: 59

## 2020-01-24 DIAGNOSIS — Z20822 Contact with and (suspected) exposure to covid-19: Secondary | ICD-10-CM

## 2020-01-28 LAB — NOVEL CORONAVIRUS, NAA: SARS-CoV-2, NAA: NOT DETECTED

## 2021-09-22 ENCOUNTER — Other Ambulatory Visit: Payer: Self-pay

## 2021-09-22 ENCOUNTER — Encounter: Payer: Self-pay | Admitting: Emergency Medicine

## 2021-09-22 ENCOUNTER — Ambulatory Visit
Admission: EM | Admit: 2021-09-22 | Discharge: 2021-09-22 | Disposition: A | Discharge status: Home / Self Care | Payer: 59

## 2021-09-22 DIAGNOSIS — U071 COVID-19: Secondary | ICD-10-CM

## 2021-09-22 MED ORDER — MOLNUPIRAVIR EUA 200MG CAPSULE: 4.0000 | ORAL_CAPSULE | Freq: Two times a day (BID) | ORAL | 0 refills | Status: AC

## 2021-09-22 MED ORDER — ALBUTEROL SULFATE HFA 108 (90 BASE) MCG/ACT IN AERS: 2.0000 | INHALATION_SPRAY | RESPIRATORY_TRACT | 0 refills | Status: AC | PRN

## 2021-09-22 NOTE — Discharge Instructions (Signed)
Today you are being treated for COVID which is a virus  Medicine which will help minimize the amount of virus within your body which ideally comes in timeline that you are safe but this medicine may not fully take the virus away.  You may continue to experience some symptoms  You may take 2 puffs of your albuterol inhaler every 4-6 hours as needed for the sensation that you are getting enough air   You can take Tylenol and/or Ibuprofen as needed for fever reduction and pain relief.   For cough: honey 1/2 to 1 teaspoon (you can dilute the honey in water or another fluid).  You can also use guaifenesin for cough. You can use a humidifier for chest congestion and cough.  If you don't have a humidifier, you can sit in the bathroom with the hot shower running.      For sore throat: try warm salt water gargles, cepacol lozenges, throat spray, warm tea or water with lemon/honey, popsicles or ice, or OTC cold relief medicine for throat discomfort.   For congestion: take a daily anti-histamine like Zyrtec, Claritin.  May also begin use of Flonase nasal spray every morning and every evening to help clear out the sinuses  It is important to stay hydrated: drink plenty of fluids (water, gatorade/powerade/pedialyte, juices, or teas) to keep your throat moisturized and help further relieve irritation/discomfort.

## 2021-09-22 NOTE — ED Provider Notes (Signed)
[facility]    CSN: [id number] Arrival date & time: 09/22/21  1631      History   Chief Complaint Chief Complaint  Patient presents with   Chills   Headache   Fever    HPI JAICION LAURIE is a 61 y.o. male.   Patient presents with fever, chills, body aches, a constant generalized headache, bilateral ear fullness and a sensation of not getting enough air beginning 1 day test positive.  Endorses recent travel from [place] but no direct sick contacts.  Has been alternating Tylenol and ibuprofen for management of been effective.  Denies pertinent medical history.  Non-smoker.  Denies chest pain or tightness, shortness of breath or wheezing, coughing.   History reviewed. No pertinent past medical history.  There are no problems to display for this patient.   Past Surgical History:  Procedure Laterality Date   HIP SURGERY     KNEE SURGERY         Home Medications    Prior to Admission medications   Medication Sig Start Date End Date Taking? Authorizing Provider  molnupiravir EUA (LAGEVRIO) 200 mg CAPS capsule Take 4 capsules (800 mg total) by mouth 2 (two) times daily for 5 days. 09/22/21 09/27/21 Yes Jaiceon Collister R, NP  albuterol (VENTOLIN HFA) 108 (90 Base) MCG/ACT inhaler Inhale 2 puffs into the lungs every 4 (four) hours as needed for wheezing or shortness of breath. 09/22/21   Delford Wingert, Elita Boone, NP  Alum Hydroxide-Mag Carbonate ([employer] HEARTBURN RELIEF PO) Take by mouth as needed.    [provider]  amoxicillin-clavulanate (AUGMENTIN) 875-125 MG tablet Take 1 tablet by mouth every 12 (twelve) hours. 09/21/18   Eustace Moore, MD    Family History Family History  Family history unknown: Yes    Social History Social History   Tobacco Use   Smoking status: Never   Smokeless tobacco: Never  Substance Use Topics   Alcohol use: Yes     Allergies   Sulfa antibiotics   Review of Systems Review of Systems Defer to HPI    Physical  Exam Triage Vital Signs ED Triage Vitals  Enc Vitals Group     BP 09/22/21 1646 129/84     Pulse Rate 09/22/21 1646 73     Resp 09/22/21 1646 18     Temp 09/22/21 1646 (!) 100.4 F (38 C)     Temp Source 09/22/21 1646 Oral     SpO2 09/22/21 1646 94 %     Weight --      Height --      Head Circumference --      Peak Flow --      Pain Score 09/22/21 1641 5     Pain Loc --      Pain Edu? --      Excl. in GC? --    No data found.  Updated Vital Signs BP 129/84 (BP Location: Left Arm)   Pulse 73   Temp (!) 100.4 F (38 C) (Oral)   Resp 18   SpO2 94%   Visual Acuity Right Eye Distance:   Left Eye Distance:   Bilateral Distance:    Right Eye Near:   Left Eye Near:    Bilateral Near:     Physical Exam Constitutional:      Appearance: Normal appearance. He is well-developed.  HENT:     Head: Normocephalic.     Right Ear: Tympanic membrane, ear canal and external ear normal.  Left Ear: Tympanic membrane, ear canal and external ear normal.     Nose: Congestion and rhinorrhea present.     Mouth/Throat:     Mouth: Mucous membranes are moist.     Pharynx: Posterior oropharyngeal erythema present.  Eyes:     Extraocular Movements: Extraocular movements intact.  Cardiovascular:     Rate and Rhythm: Normal rate and regular rhythm.     Pulses: Normal pulses.     Heart sounds: Normal heart sounds.  Pulmonary:     Effort: Pulmonary effort is normal.     Breath sounds: Normal breath sounds.  Skin:    General: Skin is warm and dry.  Neurological:     Mental Status: He is alert and oriented to person, place, and time. Mental status is at baseline.  Psychiatric:        Mood and Affect: Mood normal.        Behavior: Behavior normal.      UC Treatments / Results  Labs (all labs ordered are listed, but only abnormal results are displayed) Labs Reviewed - No data to display  EKG   Radiology No results found.  Procedures Procedures (including critical care  time)  Medications Ordered in UC Medications - No data to display  Initial Impression / Assessment and Plan / UC Course  I have reviewed the triage vital signs and the nursing notes.  Pertinent labs & imaging results that were available during my care of the patient were reviewed by me and considered in my medical decision making (see chart for details).  COVID-19  Patient is in no signs of distress nor toxic appearing.  Fever of 100.4 noted in triage.  low suspicion for pneumonia, pneumothorax or bronchitis and therefore will defer imaging.  Home COVID test is positive, will not repeat.  Prescribed viral treatment and discussed administration as well as quarantine recommendations and given note for work.  Prescribed albuterol inhaler for sensation of not getting enough air.  Lungs are clear to auscultation on exam and O2 saturation is 94% on room air.  Stable at this time.   May use additional over-the-counter medications as needed for supportive care.  May follow-up with urgent care as needed if symptoms persist or worsen.  Final Clinical Impressions(s) / UC Diagnoses   Final diagnoses:  [id number]     Discharge Instructions      Today you are being treated for COVID which is a virus  Medicine which will help minimize the amount of virus within your body which ideally comes in timeline that you are safe but this medicine may not fully take the virus away.  You may continue to experience some symptoms  You may take 2 puffs of your albuterol inhaler every 4-6 hours as needed for the sensation that you are getting enough air   You can take Tylenol and/or Ibuprofen as needed for fever reduction and pain relief.   For cough: honey 1/2 to 1 teaspoon (you can dilute the honey in water or another fluid).  You can also use guaifenesin for cough. You can use a humidifier for chest congestion and cough.  If you don't have a humidifier, you can sit in the bathroom with the hot shower  running.      For sore throat: try warm salt water gargles, cepacol lozenges, throat spray, warm tea or water with lemon/honey, popsicles or ice, or OTC cold relief medicine for throat discomfort.   For congestion: take a daily anti-histamine like Zyrtec,  Claritin.  May also begin use of Flonase nasal spray every morning and every evening to help clear out the sinuses  It is important to stay hydrated: drink plenty of fluids (water, gatorade/powerade/pedialyte, juices, or teas) to keep your throat moisturized and help further relieve irritation/discomfort.      ED Prescriptions     Medication Sig Dispense Auth. Provider   albuterol (VENTOLIN HFA) 108 (90 Base) MCG/ACT inhaler Inhale 2 puffs into the lungs every 4 (four) hours as needed for wheezing or shortness of breath. 1 each Taylah Dubiel, Elita Boone, NP   molnupiravir EUA (LAGEVRIO) 200 mg CAPS capsule Take 4 capsules (800 mg total) by mouth 2 (two) times daily for 5 days. 40 capsule Martino Tompson, Elita Boone, NP      PDMP not reviewed this encounter.   Valinda Hoar, NP 09/22/21 1712

## 2021-09-22 NOTE — ED Triage Notes (Signed)
Patient c/o chills, headache, and fever x 1 day.   Patient endorses generalized body aches.   Patient endorses a temperature of 102 F at it's highet.   Patient had a POSITIVE at home COVID test yesterday.   Patient has taken Advil and Tylenol with some relief of symptoms.
